# Patient Record
Sex: Female | Born: 1990 | Hispanic: Yes | Marital: Single | Smoking: Never smoker
Health system: Southern US, Community
[De-identification: ages and names within clinical notes are randomized; demographics above are authoritative.]

---

## 2022-02-04 ENCOUNTER — Encounter (HOSPITAL_COMMUNITY): Payer: Self-pay

## 2022-02-04 ENCOUNTER — Emergency Department (HOSPITAL_COMMUNITY)
Admission: EM | Admit: 2022-02-04 | Discharge: 2022-02-04 | Disposition: A | Discharge status: Home / Self Care | Payer: Self-pay | Attending: Emergency Medicine | Admitting: Emergency Medicine

## 2022-02-04 ENCOUNTER — Emergency Department (HOSPITAL_COMMUNITY): Payer: Self-pay

## 2022-02-04 ENCOUNTER — Other Ambulatory Visit: Payer: Self-pay

## 2022-02-04 DIAGNOSIS — R079 Chest pain, unspecified: Secondary | ICD-10-CM | POA: Diagnosis not present

## 2022-02-04 DIAGNOSIS — R109 Unspecified abdominal pain: Secondary | ICD-10-CM | POA: Diagnosis not present

## 2022-02-04 DIAGNOSIS — R519 Headache, unspecified: Secondary | ICD-10-CM | POA: Diagnosis not present

## 2022-02-04 DIAGNOSIS — S8011XA Contusion of right lower leg, initial encounter: Secondary | ICD-10-CM | POA: Diagnosis not present

## 2022-02-04 DIAGNOSIS — T148XXA Other injury of unspecified body region, initial encounter: Secondary | ICD-10-CM

## 2022-02-04 DIAGNOSIS — S40022A Contusion of left upper arm, initial encounter: Secondary | ICD-10-CM | POA: Diagnosis not present

## 2022-02-04 DIAGNOSIS — S1093XA Contusion of unspecified part of neck, initial encounter: Secondary | ICD-10-CM | POA: Diagnosis not present

## 2022-02-04 DIAGNOSIS — S40021A Contusion of right upper arm, initial encounter: Secondary | ICD-10-CM | POA: Diagnosis not present

## 2022-02-04 DIAGNOSIS — S20219A Contusion of unspecified front wall of thorax, initial encounter: Secondary | ICD-10-CM | POA: Diagnosis not present

## 2022-02-04 DIAGNOSIS — Y92410 Unspecified street and highway as the place of occurrence of the external cause: Secondary | ICD-10-CM | POA: Insufficient documentation

## 2022-02-04 DIAGNOSIS — S8012XA Contusion of left lower leg, initial encounter: Secondary | ICD-10-CM | POA: Diagnosis not present

## 2022-02-04 DIAGNOSIS — D72829 Elevated white blood cell count, unspecified: Secondary | ICD-10-CM | POA: Diagnosis not present

## 2022-02-04 DIAGNOSIS — S169XXA Unspecified injury of muscle, fascia and tendon at neck level, initial encounter: Secondary | ICD-10-CM | POA: Diagnosis present

## 2022-02-04 LAB — COMPREHENSIVE METABOLIC PANEL
ALT: 18 U/L (ref 0–44)
AST: 30 U/L (ref 15–41)
Albumin: 3.7 g/dL (ref 3.5–5.0)
Alkaline Phosphatase: 65 U/L (ref 38–126)
Anion gap: 8 (ref 5–15)
BUN: 9 mg/dL (ref 6–20)
CO2: 21 mmol/L — ABNORMAL LOW (ref 22–32)
Calcium: 8.9 mg/dL (ref 8.9–10.3)
Chloride: 108 mmol/L (ref 98–111)
Creatinine, Ser: 0.83 mg/dL (ref 0.44–1.00)
GFR, Estimated: >60 mL/min (ref >60)
Glucose, Bld: 102 mg/dL — ABNORMAL HIGH (ref 70–99)
Potassium: 4.3 mmol/L (ref 3.5–5.1)
Sodium: 137 mmol/L (ref 135–145)
Total Bilirubin: 0.3 mg/dL (ref 0.3–1.2)
Total Protein: 7 g/dL (ref 6.5–8.1)

## 2022-02-04 LAB — I-STAT BETA HCG BLOOD, ED (MC, WL, AP ONLY): I-stat hCG, quantitative: <5 m[IU]/mL (ref <5)

## 2022-02-04 LAB — CBC
HCT: 44.5 % (ref 36.0–46.0)
Hemoglobin: 14.1 g/dL (ref 12.0–15.0)
MCH: 29.6 pg (ref 26.0–34.0)
MCHC: 31.7 g/dL (ref 30.0–36.0)
MCV: 93.5 fL (ref 80.0–100.0)
Platelets: 319 10*3/uL (ref 150–400)
RBC: 4.76 MIL/uL (ref 3.87–5.11)
RDW: 14.9 % (ref 11.5–15.5)
WBC: 11.6 10*3/uL — ABNORMAL HIGH (ref 4.0–10.5)
nRBC: 0 % (ref 0.0–0.2)

## 2022-02-04 MED ORDER — IOHEXOL 300 MG/ML  SOLN
100.0000 mL | Freq: Once | INTRAMUSCULAR | Status: AC | PRN
Start: 2022-02-04 — End: 2022-02-04
  Administered 2022-02-04: 100 mL via INTRAVENOUS

## 2022-02-04 MED ORDER — FENTANYL CITRATE PF 50 MCG/ML IJ SOSY
50.0000 ug | PREFILLED_SYRINGE | Freq: Once | INTRAMUSCULAR | Status: AC
  Administered 2022-02-04: 50 ug via INTRAVENOUS
  Filled 2022-02-04: qty 1

## 2022-02-04 MED ORDER — KETOROLAC TROMETHAMINE 15 MG/ML IJ SOLN
15.0000 mg | Freq: Once | INTRAMUSCULAR | Status: AC
  Administered 2022-02-04: 15 mg via INTRAVENOUS
  Filled 2022-02-04: qty 1

## 2022-02-04 NOTE — ED Notes (Signed)
All discharge instructions including follow up care reviewed with patient and patient verbalized understanding. Patient stable at time of discharge and wheel chair provided to patient prior to leaving department.
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
## 2022-02-04 NOTE — ED Notes (Signed)
Patient transported to CT 

## 2022-02-04 NOTE — Discharge Instructions (Signed)
Please alternate tylenol 1000mg  every 6 hours with Ibuprofen 800mg  every 8 hours as needed for pain. You may also use ice and elevation as needed for pain and swelling. Please return to the emergency room if you have any worsening of your symptoms.
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
